# Patient Record
Sex: Male | Born: 1940 | Race: White | Hispanic: No | Marital: Married | Smoking: Former smoker
Health system: Southern US, Community
[De-identification: ages and names within clinical notes are randomized; demographics above are authoritative.]

## PROBLEM LIST (undated history)

## (undated) DIAGNOSIS — M1990 Unspecified osteoarthritis, unspecified site: Secondary | ICD-10-CM

## (undated) HISTORY — PX: EYE SURGERY: SHX253

---

## 2006-07-18 ENCOUNTER — Encounter: Payer: Self-pay | Admitting: Unknown Physician Specialty

## 2007-03-01 ENCOUNTER — Ambulatory Visit: Payer: Self-pay | Admitting: Family Medicine

## 2007-10-06 ENCOUNTER — Ambulatory Visit: Payer: Self-pay | Admitting: Family Medicine

## 2010-01-06 ENCOUNTER — Ambulatory Visit: Payer: Self-pay | Admitting: Gastroenterology

## 2010-02-28 ENCOUNTER — Ambulatory Visit: Payer: Self-pay | Admitting: General Practice

## 2015-01-14 ENCOUNTER — Ambulatory Visit
Admit: 2015-01-14 | Disposition: A | Discharge status: Home / Self Care | Payer: Self-pay | Attending: Gastroenterology | Admitting: Gastroenterology

## 2016-09-06 ENCOUNTER — Emergency Department: Payer: Medicare Other

## 2016-09-06 ENCOUNTER — Emergency Department
Admission: EM | Admit: 2016-09-06 | Discharge: 2016-09-06 | Disposition: A | Discharge status: Home / Self Care | Payer: Medicare Other | Attending: Emergency Medicine | Admitting: Emergency Medicine

## 2016-09-06 ENCOUNTER — Telehealth: Payer: Self-pay

## 2016-09-06 ENCOUNTER — Encounter: Payer: Self-pay | Admitting: Emergency Medicine

## 2016-09-06 DIAGNOSIS — Z79899 Other long term (current) drug therapy: Secondary | ICD-10-CM | POA: Diagnosis not present

## 2016-09-06 DIAGNOSIS — R079 Chest pain, unspecified: Secondary | ICD-10-CM | POA: Diagnosis not present

## 2016-09-06 DIAGNOSIS — R1011 Right upper quadrant pain: Secondary | ICD-10-CM | POA: Diagnosis not present

## 2016-09-06 DIAGNOSIS — R072 Precordial pain: Secondary | ICD-10-CM | POA: Diagnosis present

## 2016-09-06 HISTORY — DX: Unspecified osteoarthritis, unspecified site: M19.90

## 2016-09-06 LAB — COMPREHENSIVE METABOLIC PANEL
ALBUMIN: 4.2 g/dL (ref 3.5–5.0)
ALK PHOS: 38 U/L (ref 38–126)
ALT: 20 U/L (ref 17–63)
ANION GAP: 5 (ref 5–15)
AST: 23 U/L (ref 15–41)
BILIRUBIN TOTAL: 0.8 mg/dL (ref 0.3–1.2)
BUN: 25 mg/dL — AB (ref 6–20)
CALCIUM: 9.4 mg/dL (ref 8.9–10.3)
CO2: 26 mmol/L (ref 22–32)
CREATININE: 1 mg/dL (ref 0.61–1.24)
Chloride: 107 mmol/L (ref 101–111)
GFR calc Af Amer: >60 mL/min (ref >60)
GFR calc non Af Amer: >60 mL/min (ref >60)
GLUCOSE: 91 mg/dL (ref 65–99)
Potassium: 4.6 mmol/L (ref 3.5–5.1)
Sodium: 138 mmol/L (ref 135–145)
TOTAL PROTEIN: 7.5 g/dL (ref 6.5–8.1)

## 2016-09-06 LAB — CBC WITH DIFFERENTIAL/PLATELET
BASOS PCT: 1 %
Basophils Absolute: 0.1 10*3/uL (ref 0–0.1)
EOS ABS: 0.1 10*3/uL (ref 0–0.7)
EOS PCT: 1 %
HCT: 45.8 % (ref 40.0–52.0)
Hemoglobin: 15.7 g/dL (ref 13.0–18.0)
Lymphocytes Relative: 18 %
Lymphs Abs: 1.3 10*3/uL (ref 1.0–3.6)
MCH: 28.5 pg (ref 26.0–34.0)
MCHC: 34.3 g/dL (ref 32.0–36.0)
MCV: 83 fL (ref 80.0–100.0)
MONO ABS: 0.9 10*3/uL (ref 0.2–1.0)
MONOS PCT: 12 %
NEUTROS PCT: 68 %
Neutro Abs: 5 10*3/uL (ref 1.4–6.5)
PLATELETS: 165 10*3/uL (ref 150–440)
RBC: 5.51 MIL/uL (ref 4.40–5.90)
RDW: 14.2 % (ref 11.5–14.5)
WBC: 7.3 10*3/uL (ref 3.8–10.6)

## 2016-09-06 LAB — URINALYSIS, COMPLETE (UACMP) WITH MICROSCOPIC
Bacteria, UA: NONE SEEN
Bilirubin Urine: NEGATIVE
GLUCOSE, UA: NEGATIVE mg/dL
HGB URINE DIPSTICK: NEGATIVE
Ketones, ur: NEGATIVE mg/dL
Leukocytes, UA: NEGATIVE
NITRITE: NEGATIVE
Protein, ur: NEGATIVE mg/dL
RBC / HPF: NONE SEEN RBC/hpf (ref 0–5)
SPECIFIC GRAVITY, URINE: 1.009 (ref 1.005–1.030)
Squamous Epithelial / HPF: NONE SEEN
pH: 5 (ref 5.0–8.0)

## 2016-09-06 LAB — TROPONIN I
Troponin I: <0.03 ng/mL (ref <0.03)
Troponin I: <0.03 ng/mL (ref <0.03)

## 2016-09-06 LAB — LIPASE, BLOOD: Lipase: 35 U/L (ref 11–51)

## 2016-09-06 NOTE — ED Provider Notes (Signed)
[facility] Emergency Department Provider Note  ____________________________________________   First MD Initiated Contact with Patient 09/06/16 [id number]     (approximate)  I have reviewed the triage vital signs and the nursing notes.   HISTORY  Chief Complaint Chest Pain   HPI Mitchell Thompson is a 75 y.o. male with a history of GERD who is presenting to the emergency department today with midsternal chest pain which radiated to the bilateral chest as well as down to the upper abdomen and last about 5 minutes prior to arrival. He says that he felt weak in the knees and nauseous at the time. Denies any shortness of breath. Denies any radiation of the pain to his back. That he also a headache at that time which has resolved. Says that he is also had recently worsening right upper quadrant pain. Says that this pain has been ongoing for years. He still has his gallbladder. Denies any cardiac history. Denies any smoking, drinking or drug use.   Past Medical History:  Diagnosis Date  . Arthritis     There are no active problems to display for this patient.   No past surgical history on file.  Prior to Admission medications   Medication Sig Start Date End Date Taking? Authorizing Provider  aspirin 325 MG tablet Take 325 mg by mouth as needed.   Yes Historical Provider, MD  aspirin EC 81 MG tablet Take 1 tablet by mouth daily.   Yes Historical Provider, MD  celecoxib (CELEBREX) 200 MG capsule Take 1 capsule by mouth daily. 09/05/16  Yes Historical Provider, MD  omeprazole (PRILOSEC) 20 MG capsule Take 1 capsule by mouth daily.   Yes Historical Provider, MD  polyethylene glycol (MIRALAX / GLYCOLAX) packet Take 1 packet by mouth daily.   Yes Historical Provider, MD    Allergies Penicillins  No family history on file.  Social History Social History  Substance Use Topics  . Smoking status: Not on file  . Smokeless tobacco: Not on file  . Alcohol use Not on  file    Review of Systems Constitutional: No fever/chills Eyes: No visual changes. ENT: No sore throat. Cardiovascular: As above Respiratory: Denies shortness of breath. Gastrointestinal: no vomiting.  No diarrhea.  No constipation. Genitourinary: Negative for dysuria. Musculoskeletal: Negative for back pain. Skin: Negative for rash. Neurological: Negative for headaches, focal weakness or numbness.  10-point ROS otherwise negative.  ____________________________________________   PHYSICAL EXAM:  VITAL SIGNS: ED Triage Vitals  Enc Vitals Group     BP 09/06/16 0936 (!) 164/68     Pulse Rate 09/06/16 0936 67     Resp 09/06/16 0936 18     Temp 09/06/16 0936 97.9 F (36.6 C)     Temp Source 09/06/16 0936 Oral     SpO2 09/06/16 0936 98 %     Weight 09/06/16 0932 220 lb (99.8 kg)     Height 09/06/16 0932 6\' 1"  (1.854 m)     Head Circumference --      Peak Flow --      Pain Score --      Pain Loc --      Pain Edu? --      Excl. in GC? --     Constitutional: Alert and oriented. Well appearing and in no acute distress. Eyes: Conjunctivae are normal. PERRL. EOMI. Head: Atraumatic. Nose: No congestion/rhinnorhea. Mouth/Throat: Mucous membranes are moist.   Neck: No stridor.   Cardiovascular: Normal rate, regular rhythm. Grossly normal heart  sounds.  Good peripheral circulation With equal and bilateral radial pulses. No tenderness palpation to the chest wall. Respiratory: Normal respiratory effort.  No retractions. Lungs CTAB. Gastrointestinal: Soft with right upper quadrant abdominal pain with a negative Murphy sign. No distention. Musculoskeletal: No lower extremity tenderness nor edema.  No joint effusions. Neurologic:  Normal speech and language. No gross focal neurologic deficits are appreciated.  Skin:  Skin is warm, dry and intact. No rash noted. Psychiatric: Mood and affect are normal. Speech and behavior are normal.  ____________________________________________     LABS (all labs ordered are listed, but only abnormal results are displayed)  Labs Reviewed  COMPREHENSIVE METABOLIC PANEL - Abnormal; Notable for the following:       Result Value   BUN 25 (*)    All other components within normal limits  URINALYSIS, COMPLETE (UACMP) WITH MICROSCOPIC - Abnormal; Notable for the following:    Color, Urine YELLOW (*)    APPearance CLEAR (*)    All other components within normal limits  LIPASE, BLOOD  CBC WITH DIFFERENTIAL/PLATELET  TROPONIN I  TROPONIN I   ____________________________________________  EKG  ED ECG REPORT I, Arelia LongestSchaevitz,  Chiniqua Kilcrease M, the attending physician, personally viewed and interpreted this ECG.   Date: 09/06/2016  EKG Time: 0929  Rate: 72  Rhythm: normal sinus rhythm  Axis: Normal axis  Intervals:none  ST&T Change: No ST segment elevation or depression. No abnormal T-wave inversion.  ____________________________________________  RADIOLOGY  DG Chest 2 View (Final result)  Result time 09/06/16 10:00:20  Final result by Natasha MeadLiviu Pop, MD (09/06/16 10:00:20)           Narrative:   CLINICAL DATA: Chest pain starting 1 hour ago  EXAM: CHEST 2 VIEW  COMPARISON: None.  FINDINGS: Cardiomediastinal silhouette is unremarkable. No infiltrate or pleural effusion. No pulmonary edema. Mild degenerative changes mid and lower thoracic spine.  IMPRESSION: No active cardiopulmonary disease. Degenerative changes thoracic spine.   Electronically Signed By: Natasha MeadLiviu Pop M.D. On: 09/06/2016 10:00           US Abdomen Limited RUQ (Final result)  Result time 09/06/16 10:42:41  Final result by Gilmer MorJaime Wagner, DO (09/06/16 10:42:41)           Narrative:   CLINICAL DATA: 75 year old male with a history of right upper quadrant pain for 3 days  EXAM: US ABDOMEN LIMITED - RIGHT UPPER QUADRANT  COMPARISON: None.  FINDINGS: Gallbladder:  No gallstones or wall thickening visualized. No sonographic Murphy sign  noted by sonographer.  Common bile duct:  Diameter: 3 mm-4 mm  Liver:  No focal lesion identified. Within normal limits in parenchymal echogenicity.  IMPRESSION: Unremarkable sonographic survey of the right upper quadrant.  Signed,  Yvone NeuJaime S. Loreta AveWagner, DO  [facility]  [facility]   Electronically Signed By: Gilmer MorJaime Wagner D.O. On: 09/06/2016 10:42            ____________________________________________   PROCEDURES  Procedure(s) performed:   Procedures  Critical Care performed:   ____________________________________________   INITIAL IMPRESSION / ASSESSMENT AND PLAN / ED COURSE  Pertinent labs & imaging results that were available during my care of the patient were reviewed by me and considered in my medical decision making (see chart for details).    Clinical Course as of Sep 06 1342  Wed Sep 06, 2016  1221 Patient resting comfortably at this time. I updated him about his very reassuring lab results. He continues to be without any symptoms at  this time. We'll recheck his troponin. If negative will follow-up in the office.  [DS]    Clinical Course User Index [DS] Myrna Blazeravid Matthew Tighe Gitto, MD    ----------------------------------------- 1:43 PM on 09/06/2016 -----------------------------------------  Patient remains asymptomatic. Negative troponin 2. Also reassuring gallbladder exam. Unclear cause of the patient's chest pain. He will follow in the office. Discussed follow-up within the next 72 hours. He understands plan and is willing to comply. ____________________________________________   FINAL CLINICAL IMPRESSION(S) / ED DIAGNOSES  Final diagnoses:  RUQ pain   Chest pain   NEW MEDICATIONS STARTED DURING THIS VISIT:  New Prescriptions   No medications on file     Note:  This document was prepared using Dragon voice recognition software and may include unintentional dictation  errors.    Myrna Blazeravid Matthew Philamena Kramar, MD 09/06/16 1344

## 2016-09-06 NOTE — Telephone Encounter (Signed)
Pt was seen in ED  lmov for him to call back and schedule a follow up will try again later time

## 2016-09-06 NOTE — ED Triage Notes (Signed)
Pt reports new onset of chest pain today that started approximately one hour ago but is not currently hurting. Pt reported CP on right and left sides of chest that radiated up to his neck and down to his umbilicus. Pt reports dizziness, lightheadedness and headache as well.

## 2016-09-22 ENCOUNTER — Ambulatory Visit: Payer: Medicare Other | Admitting: Internal Medicine

## 2017-02-01 ENCOUNTER — Ambulatory Visit
Admission: EM | Admit: 2017-02-01 | Discharge: 2017-02-01 | Disposition: A | Discharge status: Home / Self Care | Payer: Medicare Other | Attending: Family Medicine | Admitting: Family Medicine

## 2017-02-01 ENCOUNTER — Encounter: Payer: Self-pay | Admitting: Emergency Medicine

## 2017-02-01 DIAGNOSIS — M545 Low back pain, unspecified: Secondary | ICD-10-CM

## 2017-02-01 MED ORDER — ORPHENADRINE CITRATE ER 100 MG PO TB12: 100.0000 mg | ORAL_TABLET | Freq: Two times a day (BID) | ORAL | 0 refills | Status: AC

## 2017-02-01 MED ORDER — CELECOXIB 400 MG PO CAPS: 400.0000 mg | ORAL_CAPSULE | Freq: Every day | ORAL | 0 refills | Status: AC

## 2017-02-01 NOTE — ED Provider Notes (Signed)
Renal CSN: [id number]     Arrival date & time 02/01/17  1039 History   First MD Initiated Contact with Patient 02/01/17 1135     Chief Complaint  Patient presents with  . Back Pain   (Consider location/radiation/quality/duration/timing/severity/associated sxs/prior Treatment) Patient is a history 76-year-old male who presents with complaint of mid lower back pain that began about 1 week ago. Patient reports a history of back issues but states it could be anywhere from a few months to over year between issue episodes. Patient does not know the trigger for this pain but in the past has happened due to picking up and putting a jug of water into the car. Patient says he feels like he is "off kilter ", which is how his symptoms are usually and then as he begins to straighten out his pain improved. Patient reports some trouble walking. Patient states his pain while sitting is sharp pain about a 6 but if he is moving around pain can increase to 8 or 9. Patient reports some occasional leg weakness denies any shooting pains down the legs, numbness, or tingling. Patient takes Celebrex 20 mg daily but has taken 2 ibuprofen at night to help with sleep but was cleared by his physician.      Past Medical History:  Diagnosis Date  . Arthritis    Past Surgical History:  Procedure Laterality Date  . EYE SURGERY     History reviewed. No pertinent family history. Social History  Substance Use Topics  . Smoking status: Former Games developer  . Smokeless tobacco: Never Used  . Alcohol use No    Review of Systems  Musculoskeletal: Positive for back pain, gait problem and myalgias.  All other systems reviewed and are negative.   Allergies  Penicillins  Home Medications   Prior to Admission medications   Medication Sig Start Date End Date Taking? Authorizing Provider  aspirin EC 81 MG tablet Take 1 tablet by mouth daily.    [provider]  celecoxib (CELEBREX) 200 MG capsule Take 1 capsule by  mouth daily. 09/05/16   [provider]  celecoxib (CELEBREX) 400 MG capsule Take 1 capsule (400 mg total) by mouth daily after breakfast. 02/01/17   Candis Schatz, PA-C  omeprazole (PRILOSEC) 20 MG capsule Take 1 capsule by mouth daily.    [provider]  orphenadrine (NORFLEX) 100 MG tablet Take 1 tablet (100 mg total) by mouth 2 (two) times daily. 02/01/17   Candis Schatz, PA-C  polyethylene glycol [facility] / Ethelene Hal) packet Take 1 packet by mouth daily.    [provider]   Meds Ordered and Administered this Visit  Medications - No data to display  BP 131/60 (BP Location: Right Arm)   Pulse 66   Temp 97.9 F (36.6 C) (Oral)   Resp 16   Ht 6' (1.829 m)   Wt 200 lb (90.7 kg)   SpO2 98%   BMI 27.12 kg/m  No data found.   Physical Exam  Constitutional: He is oriented to person, place, and time. He appears well-developed and well-nourished.  HENT:  Head: Normocephalic and atraumatic.  Eyes: EOM are normal. Pupils are equal, round, and reactive to light.  Neck: Neck supple.  Cardiovascular: Normal rate, regular rhythm and normal heart sounds.   Pulmonary/Chest: Effort normal and breath sounds normal.  Abdominal: Soft.  Musculoskeletal:       Lumbar back: He exhibits tenderness and bony tenderness.  Patient with some spinal tenderness midline just  above the sacrum as well as some mild muscle tenderness to palpation bilaterally on the lower back. Patient with good range of motion with twisting to the left but slightly decreased in the right. Patient has only about 10 flexion forward from the waist. Patient spinal alignment, posture with slight curve shaped when viewed from behind, with appearance of back muscles pulling the spine down to the right.  Neurological: He is alert and oriented to person, place, and time.  Skin: Skin is dry.    Urgent Care Course     Procedures  Labs Review Labs Reviewed - No data to display  Imaging Review No  results found.    MDM   1. Midline low back pain without sciatica, unspecified chronicity    Discharge Medication List as of 02/01/2017 11:56 AM    START taking these medications   Details  !! celecoxib (CELEBREX) 400 MG capsule Take 1 capsule (400 mg total) by mouth daily after breakfast., Starting Thu 02/01/2017, Normal    orphenadrine (NORFLEX) 100 MG tablet Take 1 tablet (100 mg total) by mouth 2 (two) times daily., Starting Thu 02/01/2017, Normal     !! - Potential duplicate medications found. Please discuss with provider.      Patient is a 76 year old male who presents with recurrent low back pain without shooting pain to the legs. Patient does report some occasional leg weakness but not upon presentation exam today. Patient does have an MRI dating back to 2008 which showed multiple levels of degenerative disease at that time. Patient given a prescription for Norflex as well as Celebrex 400 mg. Patient is to hold his 200 mg tablet of Celebrex and take his 400 mg capsule on days that he has acute pain. Once his symptoms improve patient's return taking his 200 mg daily. Patient also given Norflex for muscle relaxation. Patient advised to not drive and not operative intervention while taking the Norflex. Patient advised to take the medication every 12 hours or to 3 days and then to start taking it as needed for his symptoms. Patient advised to follow with his primary care provider for further treatment as needed. Patient verbalized understanding and agreement with plan.  Candis SchatzMichael D Clay Menser, PA-C Physical    Candis SchatzHarris, Tiffancy Moger D, PA-C 02/01/17 1358

## 2017-02-01 NOTE — ED Triage Notes (Signed)
Patient c/o lower back pain that started 5 days ago.  Patient reports lower back problems in the past.

## 2017-02-01 NOTE — Discharge Instructions (Signed)
-  on days with acute pain, take a 400mg  dose of Celebrex, if no pain, resume normal 200mg  dose -Norflex twice daily for muscle spasms. Would take for next 2-3 days and then can use as needed afterwards -warm moist heat and warm bath/shower may help with pain  -return to clinic or PCP should symptoms worsen or not improve

## 2017-10-30 ENCOUNTER — Other Ambulatory Visit: Payer: Self-pay | Admitting: Neurology

## 2017-10-31 ENCOUNTER — Other Ambulatory Visit: Payer: Self-pay | Admitting: Neurology

## 2017-10-31 DIAGNOSIS — R404 Transient alteration of awareness: Secondary | ICD-10-CM

## 2017-11-07 ENCOUNTER — Ambulatory Visit: Payer: Medicare Other

## 2017-11-14 ENCOUNTER — Ambulatory Visit: Payer: Medicare Other

## 2017-11-26 ENCOUNTER — Other Ambulatory Visit: Payer: Self-pay | Admitting: Family Medicine

## 2017-11-26 DIAGNOSIS — R1031 Right lower quadrant pain: Secondary | ICD-10-CM

## 2017-12-18 ENCOUNTER — Other Ambulatory Visit: Payer: Self-pay | Admitting: Family Medicine

## 2017-12-18 DIAGNOSIS — E7800 Pure hypercholesterolemia, unspecified: Secondary | ICD-10-CM

## 2017-12-18 DIAGNOSIS — R1031 Right lower quadrant pain: Secondary | ICD-10-CM

## 2017-12-19 ENCOUNTER — Other Ambulatory Visit: Payer: Self-pay | Admitting: Family Medicine

## 2017-12-19 DIAGNOSIS — R1031 Right lower quadrant pain: Secondary | ICD-10-CM

## 2017-12-19 DIAGNOSIS — R1903 Right lower quadrant abdominal swelling, mass and lump: Secondary | ICD-10-CM

## 2017-12-24 ENCOUNTER — Ambulatory Visit: Admission: RE | Admit: 2017-12-24 | Payer: Medicare Other | Source: Ambulatory Visit

## 2018-01-07 ENCOUNTER — Encounter (INDEPENDENT_AMBULATORY_CARE_PROVIDER_SITE_OTHER): Payer: Self-pay

## 2018-01-07 ENCOUNTER — Ambulatory Visit
Admission: RE | Admit: 2018-01-07 | Discharge: 2018-01-07 | Disposition: A | Discharge status: Home / Self Care | Payer: Medicare Other | Source: Ambulatory Visit | Attending: Family Medicine | Admitting: Family Medicine

## 2018-01-07 DIAGNOSIS — R1031 Right lower quadrant pain: Secondary | ICD-10-CM | POA: Diagnosis present

## 2018-01-07 DIAGNOSIS — R1903 Right lower quadrant abdominal swelling, mass and lump: Secondary | ICD-10-CM

## 2018-08-18 IMAGING — US US PELVIS LIMITED
1 series · 14 of 23 positions shown · non-contrast
Comparison: None in PACs

CLINICAL DATA: Right lower quadrant tenderness and fullness for
more than a year.

EXAM:
LIMITED ULTRASOUND OF PELVIS
TECHNIQUE: Limited transabdominal ultrasound examination of the pelvis was
performed.

[Series 1: us pelvis limited · 0.07mm/px · 14 of 23 slices shown]
[im 1/23]
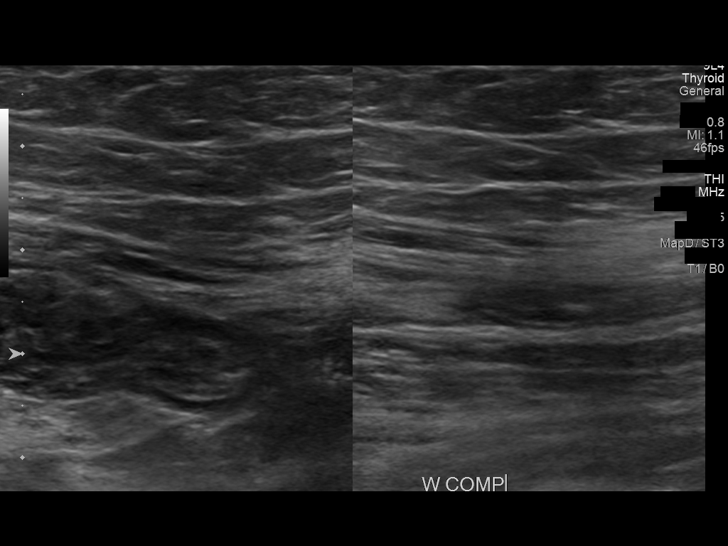
[im 3/23]
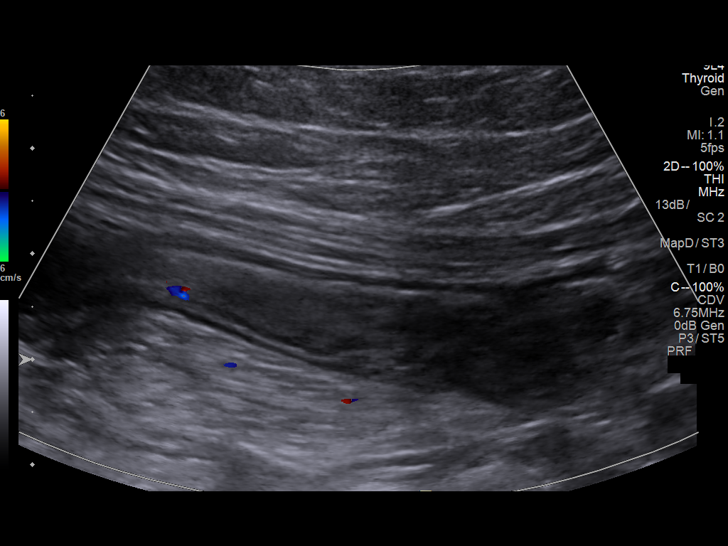
[im 5/23]
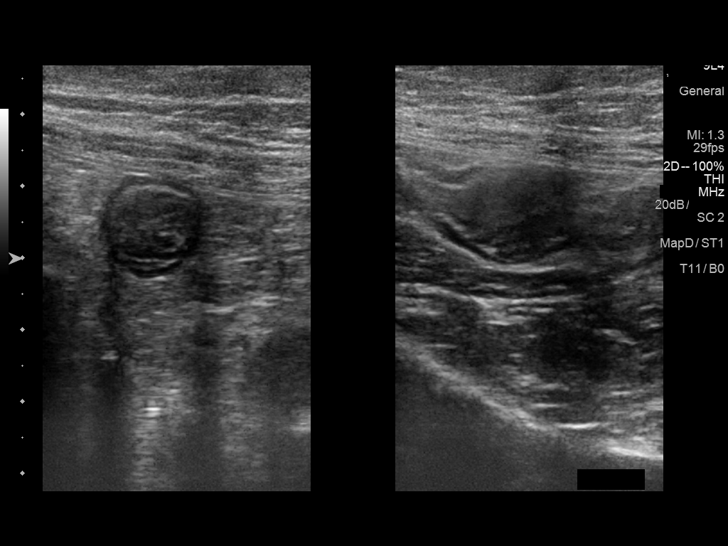
[im 6/23]
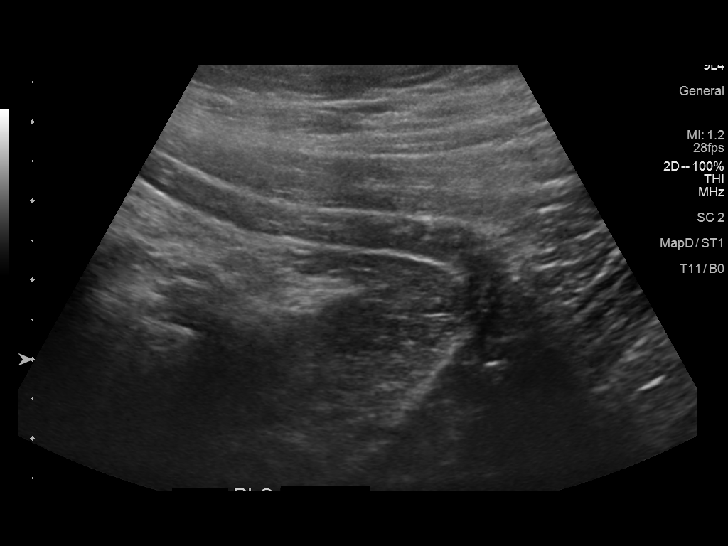
[im 8/23]
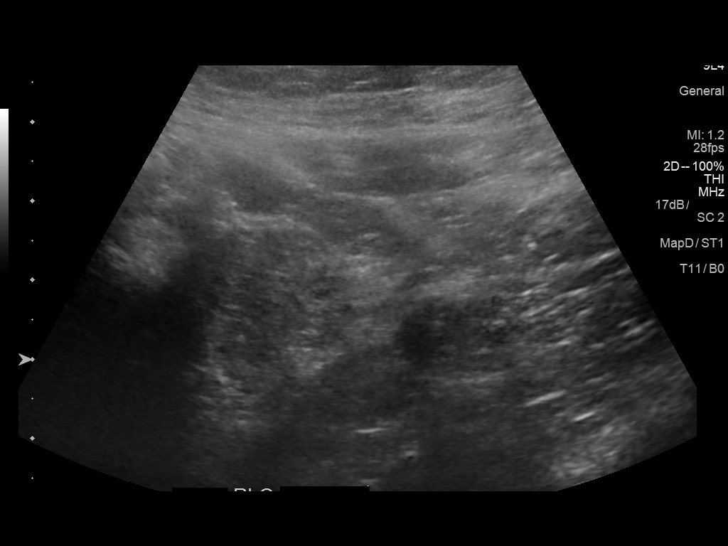
[im 10/23]
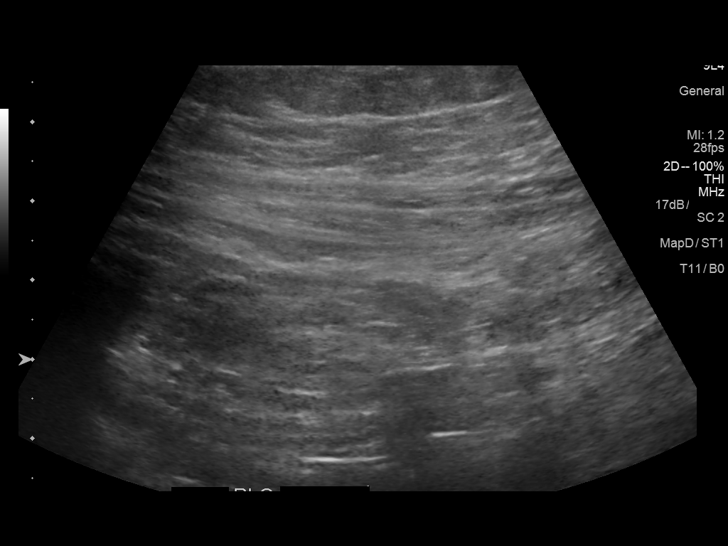
[im 11/23]
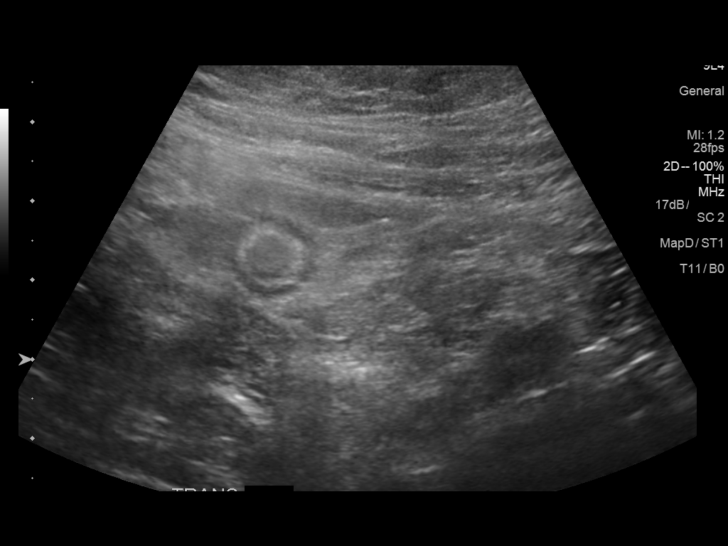
[im 13/23]
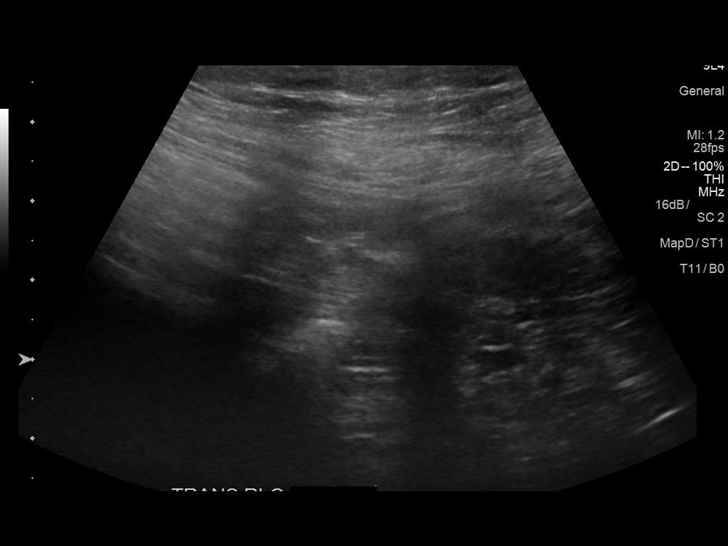
[im 14/23]
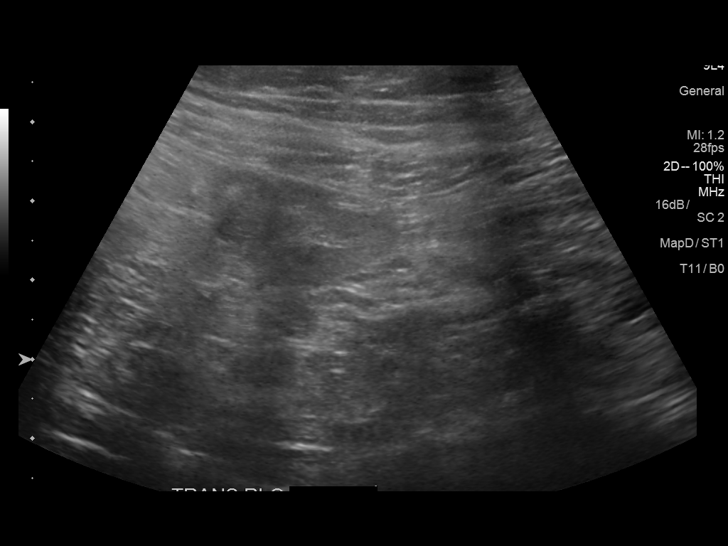
[im 16/23]
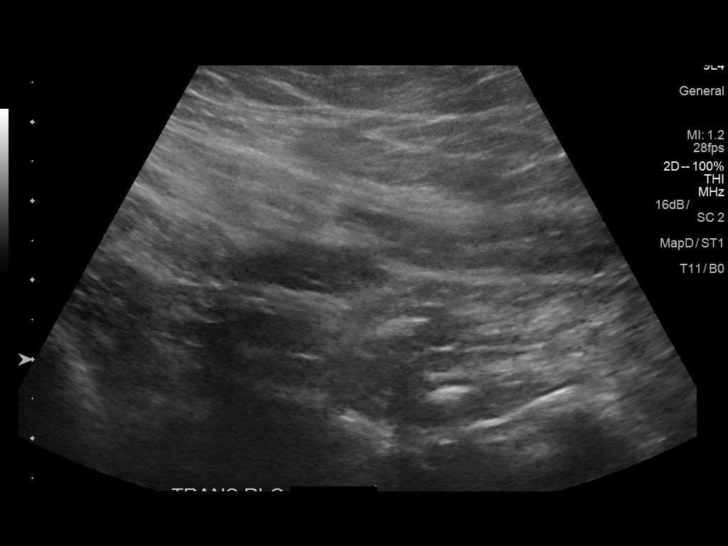
[im 18/23]
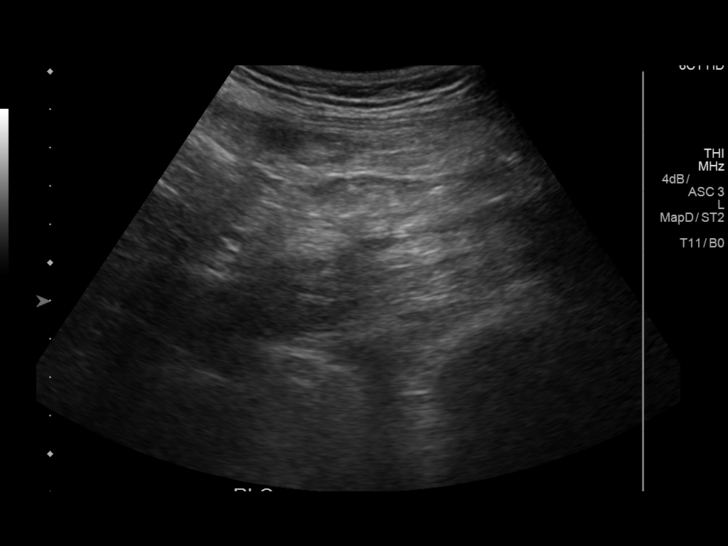
[im 19/23]
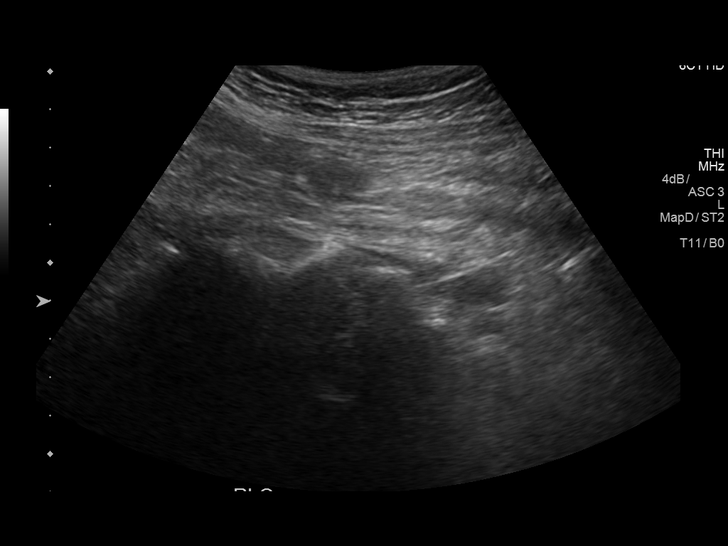
[im 21/23]
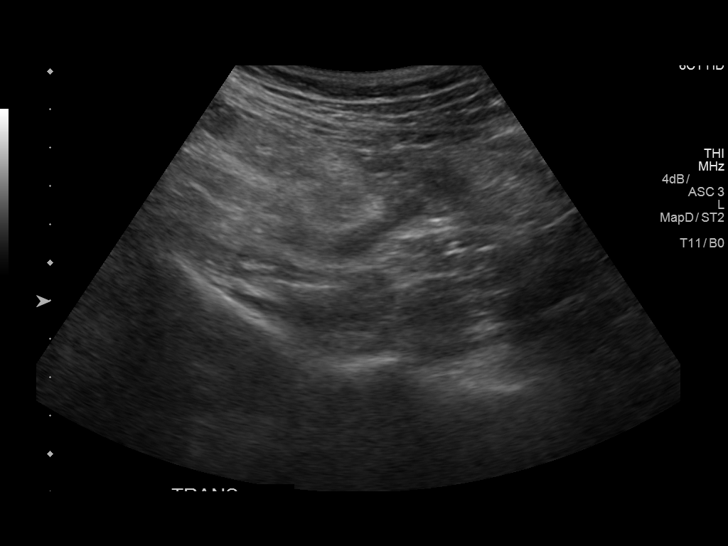
[im 23/23]
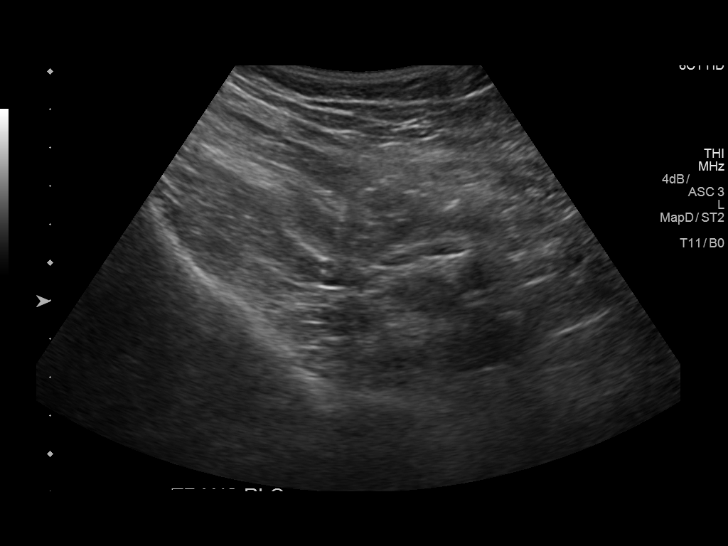

[14 of 23 positions shown; findings below may reference images not displayed]

FINDINGS: The right lower quadrant of the abdomen exhibits no cystic or solid
masses. No hernia sacs are observed. No abnormal vascularity is
demonstrated.
IMPRESSION: There is no evidence of a right inguinal hernia nor other acute
right lower quadrant abnormality.
